# Patient Record
Sex: Female | Born: 1935 | Race: White | Hispanic: No | Smoking: Never smoker
Health system: Southern US, Community
[De-identification: ages and names within clinical notes are randomized; demographics above are authoritative.]

## PROBLEM LIST (undated history)

## (undated) DIAGNOSIS — M069 Rheumatoid arthritis, unspecified: Secondary | ICD-10-CM

---

## 2015-03-30 ENCOUNTER — Encounter (HOSPITAL_COMMUNITY): Payer: Self-pay | Admitting: Emergency Medicine

## 2015-03-30 ENCOUNTER — Emergency Department (HOSPITAL_COMMUNITY)
Admission: EM | Admit: 2015-03-30 | Discharge: 2015-03-30 | Disposition: A | Discharge status: Home / Self Care | Payer: Medicare HMO | Source: Home / Self Care | Attending: Family Medicine | Admitting: Family Medicine

## 2015-03-30 ENCOUNTER — Emergency Department (INDEPENDENT_AMBULATORY_CARE_PROVIDER_SITE_OTHER): Payer: Medicare HMO

## 2015-03-30 DIAGNOSIS — R918 Other nonspecific abnormal finding of lung field: Secondary | ICD-10-CM

## 2015-03-30 DIAGNOSIS — M10071 Idiopathic gout, right ankle and foot: Secondary | ICD-10-CM

## 2015-03-30 DIAGNOSIS — M109 Gout, unspecified: Secondary | ICD-10-CM

## 2015-03-30 HISTORY — DX: Rheumatoid arthritis, unspecified: M06.9

## 2015-03-30 MED ORDER — IBUPROFEN 800 MG PO TABS
ORAL_TABLET | ORAL | Status: AC
  Filled 2015-03-30: qty 1

## 2015-03-30 MED ORDER — IBUPROFEN 800 MG PO TABS
800.0000 mg | ORAL_TABLET | Freq: Once | ORAL | Status: AC
  Administered 2015-03-30: 800 mg via ORAL

## 2015-03-30 MED ORDER — COLCHICINE 0.6 MG PO TABS: ORAL_TABLET | ORAL | Status: AC

## 2015-03-30 MED ORDER — PREDNISONE 10 MG PO TABS: 20.0000 mg | ORAL_TABLET | Freq: Every day | ORAL | Status: AC
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
## 2015-03-30 NOTE — Discharge Instructions (Signed)
YOUR DOCTOR WILL NEED TO ARRANGE A CHEST CT WITH CONTRAST FOR EVALUATION OF THE MASS IN YOUR CHEST   Gout Gout is when your joints become red, sore, and swell (inflamed). This is caused by the buildup of uric acid crystals in the joints. Uric acid is a chemical that is normally in the blood. If the level of uric acid gets too high in the blood, these crystals form in your joints and tissues. Over time, these crystals can form into masses near the joints and tissues. These masses can destroy bone and cause the bone to look misshapen (deformed). HOME CARE   Do not take aspirin for pain.  Only take medicine as told by your doctor.  Rest the joint as much as you can. When in bed, keep sheets and blankets off painful areas.  Keep the sore joints raised (elevated).  Put warm or cold packs on painful joints. Use of warm or cold packs depends on which works best for you.  Use crutches if the painful joint is in your leg.  Drink enough fluids to keep your pee (urine) clear or pale yellow. Limit alcohol, sugary drinks, and drinks with fructose in them.  Follow your diet instructions. Pay careful attention to how much protein you eat. Include fruits, vegetables, whole grains, and fat-free or low-fat milk products in your daily diet. Talk to your doctor or dietitian about the use of coffee, vitamin C, and cherries. These may help lower uric acid levels.  Keep a healthy body weight. GET HELP RIGHT AWAY IF:   You have watery poop (diarrhea), throw up (vomit), or have any side effects from medicines.  You do not feel better in 24 hours, or you are getting worse.  Your joint becomes suddenly more tender, and you have chills or a fever. MAKE SURE YOU:   Understand these instructions.  Will watch your condition.  Will get help right away if you are not doing well or get worse.   This information is not intended to replace advice given to you by your health care provider. Make sure you discuss  any questions you have with your health care provider.   Document Released: 10/11/2007 Document Revised: 01/22/2014 Document Reviewed: 08/15/2011 Elsevier Interactive Patient Education [employer].

## 2015-04-13 ENCOUNTER — Other Ambulatory Visit: Payer: Self-pay | Admitting: Oncology

## 2015-04-18 ENCOUNTER — Telehealth: Payer: Self-pay | Admitting: Internal Medicine

## 2015-04-18 NOTE — Telephone Encounter (Signed)
Contacted Dr. Josephina Gip, pulmonologist office in [place], pt scheduled for PET on 3/7 will forward results.  Office was provided fax number and contact info regarding new pt referral

## 2015-04-22 ENCOUNTER — Telehealth: Payer: Self-pay | Admitting: Oncology

## 2015-04-22 NOTE — Telephone Encounter (Signed)
Called patient re new patient appointment with Dr. Jana Hakim on 04/21/15 and 05/02/15 and was not able to reach patient. On 04/20/15 left messages on both home and cell number re referral and requesting a call back. Cell number listed as [phone] - received a call back from the owner of this cell number today reporting that this is a wrong number. Confirmed number [phone] with caller and removed this number from patient's account.

## 2015-04-26 ENCOUNTER — Other Ambulatory Visit: Payer: Self-pay | Admitting: Oncology

## 2015-04-29 ENCOUNTER — Telehealth: Payer: Self-pay | Admitting: Medical Oncology

## 2015-04-29 NOTE — Telephone Encounter (Signed)
Requested reports per Dr Starleen Arms request/

## 2015-05-04 ENCOUNTER — Telehealth: Payer: Self-pay | Admitting: Oncology

## 2015-05-04 NOTE — Telephone Encounter (Signed)
Called patient in follow to previous calls to confirm 4/21 new patient appointment. Left message - no response from patient at this time.

## 2015-05-05 ENCOUNTER — Other Ambulatory Visit: Payer: Self-pay

## 2015-05-05 DIAGNOSIS — C50919 Malignant neoplasm of unspecified site of unspecified female breast: Secondary | ICD-10-CM

## 2015-05-06 ENCOUNTER — Ambulatory Visit: Payer: Medicare HMO | Admitting: Oncology

## 2015-05-06 ENCOUNTER — Encounter: Payer: Self-pay | Admitting: Oncology

## 2015-05-06 ENCOUNTER — Other Ambulatory Visit: Payer: Medicare HMO
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
## 2017-06-15 DEATH — deceased
